# Patient Record
Sex: Male | Born: 1995 | Race: Black or African American | Hispanic: No | Marital: Single | Smoking: Current every day smoker
Health system: Southern US, Community
[De-identification: ages and names within clinical notes are randomized; demographics above are authoritative.]

## PROBLEM LIST (undated history)

## (undated) DIAGNOSIS — D75A Glucose-6-phosphate dehydrogenase (G6PD) deficiency without anemia: Secondary | ICD-10-CM

---

## 2017-12-31 ENCOUNTER — Other Ambulatory Visit: Payer: Self-pay

## 2017-12-31 ENCOUNTER — Emergency Department
Admission: EM | Admit: 2017-12-31 | Discharge: 2017-12-31 | Disposition: A | Discharge status: Home / Self Care | Payer: Self-pay | Attending: Emergency Medicine | Admitting: Emergency Medicine

## 2017-12-31 ENCOUNTER — Encounter: Payer: Self-pay | Admitting: Emergency Medicine

## 2017-12-31 DIAGNOSIS — K0889 Other specified disorders of teeth and supporting structures: Secondary | ICD-10-CM | POA: Insufficient documentation

## 2017-12-31 MED ORDER — TRAMADOL HCL 50 MG PO TABS: 50.0000 mg | ORAL_TABLET | Freq: Four times a day (QID) | ORAL | 0 refills | Status: AC | PRN

## 2017-12-31 MED ORDER — PENICILLIN V POTASSIUM 500 MG PO TABS: 500.0000 mg | ORAL_TABLET | Freq: Four times a day (QID) | ORAL | 0 refills | Status: DC

## 2017-12-31 NOTE — ED Provider Notes (Signed)
[facility] Emergency Department Provider Note  Time seen: 7:35 AM  I have reviewed the triage vital signs and the nursing notes.   HISTORY  Chief Complaint Dental Pain    HPI Roger Olson is a 22 y.o. male with no past medical history who presents to the emergency department for dental pain.  According to the patient he was eating chicken wings last night, states when he bit down he felt a crack and part of his tooth came out.  Patient has fractured his left upper molar.  States pain especially when breathing in air or attempting to drink anything.  Came to the emergency department for evaluation.  Denies any fever.  No vomiting.  No other medical complaints.   No past medical history on file.  There are no active problems to display for this patient.   History reviewed. No pertinent surgical history.  Prior to Admission medications   Medication Sig Start Date End Date Taking? Authorizing Provider  penicillin v potassium (VEETID) 500 MG tablet Take 1 tablet (500 mg total) by mouth 4 (four) times daily. 12/31/17   Minna AntisPaduchowski, Jc Veron, MD  traMADol (ULTRAM) 50 MG tablet Take 1 tablet (50 mg total) by mouth every 6 (six) hours as needed. 12/31/17   Minna AntisPaduchowski, Jawaan Adachi, MD    No Known Allergies  No family history on file.  Social History Social History   Tobacco Use  . Smoking status: Not on file  Substance Use Topics  . Alcohol use: Not on file  . Drug use: Not on file    Review of Systems Constitutional: Negative for fever ENT: Left upper dental pain Gastrointestinal: Negative for vomiting All other ROS negative  ____________________________________________   PHYSICAL EXAM:  VITAL SIGNS: ED Triage Vitals  Enc Vitals Group     BP 12/31/17 0504 (!) 141/90     Pulse Rate 12/31/17 0504 60     Resp 12/31/17 0504 20     Temp 12/31/17 0504 98.2 F (36.8 C)     Temp Source 12/31/17 0504 Oral     SpO2 12/31/17 0504 100 %     Weight  12/31/17 0456 154 lb (69.9 kg)     Height 12/31/17 0456 6' (1.829 m)     Head Circumference --      Peak Flow --      Pain Score 12/31/17 0457 10     Pain Loc --      Pain Edu? --      Excl. in GC? --     Constitutional: Alert and oriented. Well appearing and in no distress. Eyes: Normal exam ENT   Head: Normocephalic and atraumatic   Mouth/Throat: Mucous membranes are moist.  Patient does have a fractured left upper molar.  No signs of abscess. Cardiovascular: Normal rate, regular rhythm.  Respiratory: Normal respiratory effort without tachypnea nor retractions. Breath sounds are clear  Musculoskeletal: Nontender with normal range of motion in all extremities.  Neurologic:  Normal speech and language. No gross focal neurologic deficits  Skin:  Skin is warm, dry and intact.  Psychiatric: Mood and affect are normal.   ____________________________________________    INITIAL IMPRESSION / ASSESSMENT AND PLAN / ED COURSE  Pertinent labs & imaging results that were available during my care of the patient were reviewed by me and considered in my medical decision making (see chart for details).  Patient presents emergency department for left upper molar fracture while eating last night.  I discussed with the patient  the need to follow-up with a dentist as soon as possible as the tooth will either need to be extracted or crowned.  We will place the patient a short course of pain medication, cover with antibiotics.  Patient will begin calling dental clinics today.  ____________________________________________   FINAL CLINICAL IMPRESSION(S) / ED DIAGNOSES  Dental fracture Dental pain    Minna AntisPaduchowski, Miqueas Whilden, MD 12/31/17 [phone]

## 2017-12-31 NOTE — ED Notes (Signed)
This RN to bedside at this time to introduce self to patient. Pt sitting on side of bed in NAD, A&O x 4, skin warm, dry, and intact, respirations even and unlabored. Pt denies any needs at this time. Will continue to monitor for further patient needs.

## 2017-12-31 NOTE — Discharge Instructions (Addendum)
OPTIONS FOR DENTAL FOLLOW UP CARE ° °[place] Department of Health and Human Services - Local Safety Net Dental Clinics °http://www.ncdhhs.gov/dph/oralhealth/services/safetynetclinics.htm °  °[facility] (336-562-3123) ° °[facility] [place] (919-933-9087) ° °[facility] ([phone] ext [phone]) ° °[facility] ([phone]) ° °[facility] ([phone]) °This clinic caters to the indigent population and is on a lottery system. °Location: °[facility], [address], [place] °Clinic Hours: °Wednesdays from 6pm - 9pm, patients seen by a lottery system. °For dates, call or go to www.med.[facility].edu/[facility]/patients/Dental-[facility] °Services: °Cleanings, fillings and simple extractions. °Payment Options: °DENTAL WORK IS FREE OF CHARGE. Bring proof of income or support. °Best way to get seen: °Arrive at 5:15 pm - this is a lottery, NOT first come/first serve, so arriving earlier will not increase your chances of being seen. °  °  °[facility] °[phone] °Select option 1 for emergencies °  °Location: °[facility], [address], [place] °Clinic Hours: °No walk-ins accepted - call the day before to schedule an appointment. °Check in times are 9:30 am and 1:30 pm. °Services: °Simple extractions, temporary fillings, pulpectomy/pulp debridement, uncomplicated abscess drainage. °Payment Options: °PAYMENT IS DUE AT THE TIME OF SERVICE.  Fee is usually $100-200, additional surgical procedures (e.g. abscess drainage) may be extra. °Cash, checks, Visa/MasterCard accepted.  Can file Medicaid if patient is covered for dental - patient should call case worker to check. °No discount for [facility] patients. °Best way to get seen: °MUST call the day before and get onto the schedule. Can usually be seen the next 1-2 days. No walk-ins accepted. °  °  °[facility] °[phone] °   °Location: °[facility], [address], [place] °Clinic Hours: °M, W, Th, F 8am or 1:30pm, Tues 9a or 1:30 - first come/first served. °Services: °Simple extractions, temporary fillings, uncomplicated abscess drainage.  You do not need to be an [place] resident. °Payment Options: °PAYMENT IS DUE AT THE TIME OF SERVICE. °Dental insurance, otherwise sliding scale - bring proof of income or support. °Depending on income and treatment needed, cost is usually $50-200. °Best way to get seen: °Arrive early as it is first come/first served. °  °  °[facility] °[phone] °  °Location: °[address] °Clinic Hours: °Mon-Thu 8a-5p °Services: °Most basic dental services including extractions and fillings. °Payment Options: °PAYMENT IS DUE AT THE TIME OF SERVICE. °Sliding scale, up to 50% off - bring proof if income or support. °Medicaid with dental option accepted. °Best way to get seen: °Call to schedule an appointment, can usually be seen within 2 weeks OR they will try to see walk-ins - show up at 8a or 2p (you may have to wait). °  °  °[facility] °919-245-2435 °[place] RESIDENTS ONLY °  °Location: °[facility], [address], [place], [place] [address] °Clinic Hours: By appointment only. °Monday - Thursday 8am-5pm, Friday 8am-12pm °Services: Cleanings, fillings, extractions. °Payment Options: °PAYMENT IS DUE AT THE TIME OF SERVICE. °Cash, Visa or MasterCard. Sliding scale - $30 minimum per service. °Best way to get seen: °Come in to office, complete packet and make an appointment - need proof of income °or support monies for each household member and proof of [place] residence. °Usually takes about a month to get in. °  °  °[facility] °[phone] °  °Location: °[address]   [place] °Clinic Hours: Walk-in Urgent Care Dental Services are offered Monday-Friday  mornings only. °The numbers of emergencies accepted daily is limited to the number of °providers available. °Maximum 15 - Mondays, Wednesdays & Thursdays °Maximum 10 - Tuesdays & Fridays °Services: °You do not need to be a [place] resident to be seen for a dental emergency. °Emergencies are defined as pain, swelling, abnormal bleeding, or dental trauma. Walkins will receive x-rays if needed. °NOTE: Dental cleaning is not an emergency. °Payment Options: °PAYMENT IS DUE AT THE TIME OF SERVICE. °Minimum co-pay is $40.00 for uninsured patients. °Minimum co-pay is $3.00 for Medicaid with dental coverage. °Dental Insurance is accepted and must be presented at time of visit. °Medicare does not cover dental. °Forms of payment: Cash, credit card, checks. °Best way to get seen: °If not previously registered with the clinic, walk-in dental registration begins at 7:15 am and is on a first come/first serve basis. °If previously registered with the clinic, call to make an appointment. °  °  °[facility] °[phone] °[place] RESIDENTS ONLY °  °Location: °[address], [place], [place] °Clinic Hours: °Mon-Thu 10a-2p °Services: Extractions only! °Payment Options: °FREE (donations accepted) - bring proof of income or support °Best way to get seen: °Call and schedule an appointment OR come at 8am on the 1st Monday of every month (except for holidays) when it is first come/first served. °  °  °[facility] °[phone] °  °Location: °[address], [place] °Clinic Hours: °Friday mornings °Services, Payment Options, Best way to get seen: °Call for info °

## 2017-12-31 NOTE — ED Triage Notes (Addendum)
Patient ambulatory to triage with steady gait, without difficulty or distress noted; reports left upper toothache tonight; no meds taken PTA; pt is irritable, not wanting to answer questioning; explained to pt importance of obtaining medical hx and information in order to treat pt appropriately
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
## 2018-01-02 ENCOUNTER — Other Ambulatory Visit: Payer: Self-pay

## 2018-01-02 ENCOUNTER — Emergency Department
Admission: EM | Admit: 2018-01-02 | Discharge: 2018-01-02 | Discharge status: Against Medical Advice | Payer: Self-pay | Attending: Emergency Medicine | Admitting: Emergency Medicine

## 2018-01-02 DIAGNOSIS — Z5321 Procedure and treatment not carried out due to patient leaving prior to being seen by health care provider: Secondary | ICD-10-CM | POA: Insufficient documentation

## 2018-01-02 DIAGNOSIS — K0889 Other specified disorders of teeth and supporting structures: Secondary | ICD-10-CM | POA: Insufficient documentation

## 2018-01-02 NOTE — ED Notes (Signed)
No answer when called several times from lobby 

## 2018-01-02 NOTE — ED Triage Notes (Signed)
Pt arrives to ED via POV from home with c/o left upper-sided dental pain. Pt states he was seen here on Tuesday, r/x'd Tramadol and given Penicillin. Pt returns for uncontrolled pain relief and unalleviated s/x's.

## 2018-07-08 ENCOUNTER — Emergency Department
Admission: EM | Admit: 2018-07-08 | Discharge: 2018-07-08 | Disposition: A | Discharge status: Home / Self Care | Payer: BC Managed Care – PPO | Attending: Emergency Medicine | Admitting: Emergency Medicine

## 2018-07-08 ENCOUNTER — Emergency Department: Payer: BC Managed Care – PPO

## 2018-07-08 ENCOUNTER — Encounter: Payer: Self-pay | Admitting: Emergency Medicine

## 2018-07-08 ENCOUNTER — Other Ambulatory Visit: Payer: Self-pay

## 2018-07-08 DIAGNOSIS — Y9355 Activity, bike riding: Secondary | ICD-10-CM | POA: Insufficient documentation

## 2018-07-08 DIAGNOSIS — Y92410 Unspecified street and highway as the place of occurrence of the external cause: Secondary | ICD-10-CM | POA: Insufficient documentation

## 2018-07-08 DIAGNOSIS — F17200 Nicotine dependence, unspecified, uncomplicated: Secondary | ICD-10-CM | POA: Insufficient documentation

## 2018-07-08 DIAGNOSIS — G44319 Acute post-traumatic headache, not intractable: Secondary | ICD-10-CM

## 2018-07-08 DIAGNOSIS — Y999 Unspecified external cause status: Secondary | ICD-10-CM | POA: Insufficient documentation

## 2018-07-08 DIAGNOSIS — S0990XA Unspecified injury of head, initial encounter: Secondary | ICD-10-CM | POA: Diagnosis present

## 2018-07-08 DIAGNOSIS — T07XXXA Unspecified multiple injuries, initial encounter: Secondary | ICD-10-CM

## 2018-07-08 HISTORY — DX: Glucose-6-phosphate dehydrogenase (G6PD) deficiency without anemia: D75.A

## 2018-07-08 MED ORDER — OXYCODONE-ACETAMINOPHEN 5-325 MG PO TABS
1.0000 | ORAL_TABLET | Freq: Once | ORAL | Status: AC
  Administered 2018-07-08: 1 via ORAL
  Filled 2018-07-08: qty 1

## 2018-07-08 MED ORDER — HYDROCODONE-ACETAMINOPHEN 5-325 MG PO TABS: 1.0000 | ORAL_TABLET | ORAL | 0 refills | Status: DC | PRN

## 2018-07-08 MED ORDER — METHOCARBAMOL 500 MG PO TABS: 500.0000 mg | ORAL_TABLET | Freq: Four times a day (QID) | ORAL | 0 refills | Status: AC

## 2018-07-08 MED ORDER — PREDNISONE 50 MG PO TABS: 50.0000 mg | ORAL_TABLET | Freq: Every day | ORAL | 0 refills | Status: DC

## 2018-07-08 MED ORDER — PREDNISONE 20 MG PO TABS
60.0000 mg | ORAL_TABLET | Freq: Once | ORAL | Status: AC
  Administered 2018-07-08: 60 mg via ORAL
  Filled 2018-07-08: qty 3
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
## 2018-07-08 NOTE — ED Notes (Addendum)
E sign not working. PT verbalized d/c instructions

## 2018-07-11 ENCOUNTER — Emergency Department: Payer: BC Managed Care – PPO

## 2018-07-11 ENCOUNTER — Emergency Department
Admission: EM | Admit: 2018-07-11 | Discharge: 2018-07-11 | Disposition: A | Discharge status: Home / Self Care | Payer: BC Managed Care – PPO | Attending: Emergency Medicine | Admitting: Emergency Medicine

## 2018-07-11 ENCOUNTER — Encounter: Payer: Self-pay | Admitting: Emergency Medicine

## 2018-07-11 DIAGNOSIS — R1030 Lower abdominal pain, unspecified: Secondary | ICD-10-CM

## 2018-07-11 DIAGNOSIS — F17210 Nicotine dependence, cigarettes, uncomplicated: Secondary | ICD-10-CM | POA: Diagnosis not present

## 2018-07-11 LAB — URINALYSIS, COMPLETE (UACMP) WITH MICROSCOPIC
Bacteria, UA: NONE SEEN
Bilirubin Urine: NEGATIVE
Glucose, UA: NEGATIVE mg/dL
Hgb urine dipstick: NEGATIVE
Ketones, ur: NEGATIVE mg/dL
Nitrite: NEGATIVE
Protein, ur: NEGATIVE mg/dL
Specific Gravity, Urine: 1.014 (ref 1.005–1.030)
pH: 6 (ref 5.0–8.0)

## 2018-07-11 LAB — COMPREHENSIVE METABOLIC PANEL
ALT: 10 U/L (ref 0–44)
AST: 12 U/L — ABNORMAL LOW (ref 15–41)
Albumin: 4 g/dL (ref 3.5–5.0)
Alkaline Phosphatase: 37 U/L — ABNORMAL LOW (ref 38–126)
Anion gap: 8 (ref 5–15)
BUN: 6 mg/dL (ref 6–20)
CO2: 29 mmol/L (ref 22–32)
Calcium: 9 mg/dL (ref 8.9–10.3)
Chloride: 102 mmol/L (ref 98–111)
Creatinine, Ser: 0.99 mg/dL (ref 0.61–1.24)
GFR calc Af Amer: >60 mL/min (ref >60)
GFR calc non Af Amer: >60 mL/min (ref >60)
Glucose, Bld: 94 mg/dL (ref 70–99)
Potassium: 3.9 mmol/L (ref 3.5–5.1)
Sodium: 139 mmol/L (ref 135–145)
Total Bilirubin: 1 mg/dL (ref 0.3–1.2)
Total Protein: 6.7 g/dL (ref 6.5–8.1)

## 2018-07-11 LAB — CBC
HCT: 42.1 % (ref 39.0–52.0)
Hemoglobin: 13.9 g/dL (ref 13.0–17.0)
MCH: 29.6 pg (ref 26.0–34.0)
MCHC: 33 g/dL (ref 30.0–36.0)
MCV: 89.8 fL (ref 80.0–100.0)
Platelets: 244 10*3/uL (ref 150–400)
RBC: 4.69 MIL/uL (ref 4.22–5.81)
RDW: 12.1 % (ref 11.5–15.5)
WBC: 9.9 10*3/uL (ref 4.0–10.5)
nRBC: 0 % (ref 0.0–0.2)

## 2018-07-11 LAB — LIPASE, BLOOD: Lipase: 29 U/L (ref 11–51)

## 2018-07-11 MED ORDER — MORPHINE SULFATE (PF) 4 MG/ML IV SOLN
4.0000 mg | Freq: Once | INTRAVENOUS | Status: AC
  Administered 2018-07-11: 4 mg via INTRAVENOUS
  Filled 2018-07-11: qty 1

## 2018-07-11 MED ORDER — OXYCODONE-ACETAMINOPHEN 5-325 MG PO TABS: 1.0000 | ORAL_TABLET | ORAL | 0 refills | Status: AC | PRN

## 2018-07-11 MED ORDER — IOHEXOL 240 MG/ML SOLN
50.0000 mL | Freq: Once | INTRAMUSCULAR | Status: AC | PRN
  Administered 2018-07-11: 50 mL via ORAL

## 2018-07-11 MED ORDER — CEPHALEXIN 500 MG PO CAPS: 500.0000 mg | ORAL_CAPSULE | Freq: Two times a day (BID) | ORAL | 0 refills | Status: AC

## 2018-07-11 MED ORDER — IOHEXOL 300 MG/ML  SOLN
100.0000 mL | Freq: Once | INTRAMUSCULAR | Status: AC | PRN
  Administered 2018-07-11: 100 mL via INTRAVENOUS

## 2018-07-11 MED ORDER — SODIUM CHLORIDE 0.9% FLUSH
3.0000 mL | Freq: Once | INTRAVENOUS | Status: AC
  Administered 2018-07-11: 3 mL via INTRAVENOUS

## 2018-07-11 MED ORDER — OXYCODONE-ACETAMINOPHEN 5-325 MG PO TABS
1.0000 | ORAL_TABLET | Freq: Once | ORAL | Status: AC
  Administered 2018-07-11: 1 via ORAL
  Filled 2018-07-11: qty 1

## 2018-07-11 MED ORDER — ONDANSETRON HCL 4 MG/2ML IJ SOLN
4.0000 mg | Freq: Once | INTRAMUSCULAR | Status: AC
  Administered 2018-07-11: 4 mg via INTRAVENOUS
  Filled 2018-07-11: qty 2

## 2018-07-11 NOTE — ED Notes (Signed)
Patient transported to CT 
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
## 2018-07-11 NOTE — Discharge Instructions (Addendum)
If you continue to have pain in the left hip, STOP taking the hydrocodone and you can start taking the oxycodone which is slightly stronger.  DO NOT take both of these medications at the same time.  You can continue the steroid and muscle relaxant.  We have also prescribed and take this as prescribed and finish the full 7-day course.  Antibiotic as you may have a mild bladder infection.  Your lab work and CT scan today did not show any concerning findings in your abdomen.  Return to the ER for new, worsening, or persistent severe pain, fever, vomiting, or any other new or worsening symptoms that concern you.

## 2018-07-11 NOTE — ED Notes (Signed)
Patient resting on stretcher. Posture relaxed.  Skin warm and dry.  MAE equally and strong.  texting on phone.  States abdominal pain has improved, but continues to c/o left hip pain.  Patient is resting and in NAD

## 2018-07-11 NOTE — ED Notes (Signed)
Pt observed sitting w/c using his phone, no distress noted, cont to monitor

## 2018-07-11 NOTE — ED Notes (Signed)
Pt observed rolling himself outside in w/c

## 2018-09-22 ENCOUNTER — Emergency Department
Admission: EM | Admit: 2018-09-22 | Discharge: 2018-09-22 | Disposition: A | Discharge status: Home / Self Care | Payer: BC Managed Care – PPO | Attending: Student in an Organized Health Care Education/Training Program | Admitting: Student in an Organized Health Care Education/Training Program

## 2018-09-22 ENCOUNTER — Other Ambulatory Visit: Payer: Self-pay

## 2018-09-22 DIAGNOSIS — F17200 Nicotine dependence, unspecified, uncomplicated: Secondary | ICD-10-CM | POA: Insufficient documentation

## 2018-09-22 DIAGNOSIS — Z79899 Other long term (current) drug therapy: Secondary | ICD-10-CM | POA: Diagnosis not present

## 2018-09-22 DIAGNOSIS — Z20828 Contact with and (suspected) exposure to other viral communicable diseases: Secondary | ICD-10-CM | POA: Insufficient documentation

## 2018-09-22 DIAGNOSIS — J029 Acute pharyngitis, unspecified: Secondary | ICD-10-CM | POA: Diagnosis present

## 2018-09-22 LAB — GROUP A STREP BY PCR: Group A Strep by PCR: NOT DETECTED

## 2018-09-22 MED ORDER — ACETAMINOPHEN 160 MG/5ML PO SOLN
650.0000 mg | Freq: Once | ORAL | Status: AC
  Administered 2018-09-22: 21:00:00 650 mg via ORAL
  Filled 2018-09-22: qty 20.3

## 2018-09-22 MED ORDER — DEXAMETHASONE 10 MG/ML FOR PEDIATRIC ORAL USE
10.0000 mg | Freq: Once | INTRAMUSCULAR | Status: AC
  Administered 2018-09-22: 10 mg via ORAL
  Filled 2018-09-22: qty 1

## 2018-09-22 NOTE — ED Provider Notes (Signed)
[facility] Emergency Department Provider Note  ____________________________________________  Time seen: Approximately 9:10 PM  I have reviewed the triage vital signs and the nursing notes.   HISTORY  Chief Complaint Sore Throat    HPI Roger Olson is a 23 y.o. male presents to the emergency department with pharyngitis and headache for the past 4 days.  Patient reports that he feels discomfort at midline throat.  Patient states that he feels discomfort when he swallows any takes a deep breath.  He denies associated rhinorrhea, nasal congestion or nonproductive cough.  No abdominal discomfort or nausea.  Patient denies chest pain, chest tightness or abdominal pain.  No other alleviating measures have been attempted.        Past Medical History:  Diagnosis Date  . G6PD deficiency     There are no active problems to display for this patient.   No past surgical history on file.  Prior to Admission medications   Medication Sig Start Date End Date Taking? Authorizing Provider  methocarbamol (ROBAXIN) 500 MG tablet Take 1 tablet (500 mg total) by mouth 4 (four) times daily. 07/08/18   Cuthriell, Delorise RoyalsJonathan D, PA-C  penicillin v potassium (VEETID) 500 MG tablet Take 1 tablet (500 mg total) by mouth 4 (four) times daily. 12/31/17   Minna AntisPaduchowski, Kevin, MD  predniSONE (DELTASONE) 50 MG tablet Take 1 tablet (50 mg total) by mouth daily with breakfast. 07/08/18   Cuthriell, Delorise RoyalsJonathan D, PA-C  traMADol (ULTRAM) 50 MG tablet Take 1 tablet (50 mg total) by mouth every 6 (six) hours as needed. 12/31/17   Minna AntisPaduchowski, Kevin, MD    Allergies Ibuprofen and Shellfish allergy  No family history on file.  Social History Social History   Tobacco Use  . Smoking status: Current Every Day Smoker  . Smokeless tobacco: Never Used  Substance Use Topics  . Alcohol use: Not Currently  . Drug use: Not on file     Review of Systems  Constitutional: No  fever/chills Eyes: No visual changes. No discharge ENT: Patient has pharyngitis. Cardiovascular: no chest pain. Respiratory: no cough. No SOB. Gastrointestinal: No abdominal pain.  No nausea, no vomiting.  No diarrhea.  No constipation.  Genitourinary: Negative for dysuria. No hematuria Musculoskeletal: Negative for musculoskeletal pain. Skin: Negative for rash, abrasions, lacerations, ecchymosis. Neurological: Patient has headache.   ____________________________________________   PHYSICAL EXAM:  VITAL SIGNS: ED Triage Vitals [09/22/18 2032]  Enc Vitals Group     BP 132/85     Pulse Rate 99     Resp 20     Temp 99.8 F (37.7 C)     Temp Source Oral     SpO2 97 %     Weight 154 lb (69.9 kg)     Height 5\' 11"  (1.803 m)     Head Circumference      Peak Flow      Pain Score 10     Pain Loc      Pain Edu?      Excl. in GC?      Constitutional: Alert and oriented. Well appearing and in no acute distress. Eyes: Conjunctivae are normal. PERRL. EOMI. Head: Atraumatic. ENT:      Ears: TMs are pearly.      Nose: No congestion/rhinnorhea.      Mouth/Throat: Mucous membranes are moist.  Posterior pharynx is erythematous with petechiae visualized.  Uvula is midline.  Tonsillar hypertrophy is symmetric. Neck: No stridor.  No cervical spine tenderness to  palpation. Hematological/Lymphatic/Immunilogical: Palpable cervical lymphadenopathy.  Cardiovascular: Normal rate, regular rhythm. Normal S1 and S2.  Good peripheral circulation. Respiratory: Normal respiratory effort without tachypnea or retractions. Lungs CTAB. Good air entry to the bases with no decreased or absent breath sounds. Gastrointestinal: Bowel sounds 4 quadrants. Soft and nontender to palpation. No guarding or rigidity. No palpable masses. No distention. No CVA tenderness. Musculoskeletal: Full range of motion to all extremities. No gross deformities appreciated. Neurologic:  Normal speech and language. No gross focal  neurologic deficits are appreciated.  Skin:  Skin is warm, dry and intact. No rash noted. Psychiatric: Mood and affect are normal. Speech and behavior are normal. Patient exhibits appropriate insight and judgement.   ____________________________________________   LABS (all labs ordered are listed, but only abnormal results are displayed)  Labs Reviewed  GROUP A STREP BY PCR  SARS CORONAVIRUS 2 (TAT 6-24 HRS)   ____________________________________________  EKG   ____________________________________________  RADIOLOGY   No results found.  ____________________________________________    PROCEDURES  Procedure(s) performed:    Procedures    Medications  acetaminophen (TYLENOL) solution 650 mg (650 mg Oral Given 09/22/18 2124)  dexamethasone (DECADRON) 10 MG/ML injection for Pediatric ORAL use 10 mg (10 mg Oral Given 09/22/18 2126)     ____________________________________________   INITIAL IMPRESSION / ASSESSMENT AND PLAN / ED COURSE  Pertinent labs & imaging results that were available during my care of the patient were reviewed by me and considered in my medical decision making (see chart for details).  Review of the [place] CSRS was performed in accordance of the [place] prior to dispensing any controlled drugs.           Assessment and plan Pharyngitis 23 y/o male presents to the ED with pharyngitis and headache for the past four days.   Vital signs were reassuring at triage. Posterior pharynx is erythematous with anterior cervical lymphadenopathy.  Tonsillar hypertrophy is symmetric without uvular deviation.  Differential diagnosis includes group A strep pharyngitis, unspecified viral pharyngitis and COVID-19.  Group A strep testing was negative in the emergency department.  COVID-19 testing is pending at this time.  Patient was given oral Decadron in the emergency department as well as Tylenol and he reports that his pharyngitis improved.  Return precautions  were given.  All patient questions were answered.  Roger Olson was evaluated in Emergency Department on 09/22/2018 for the symptoms described in the history of present illness. He was evaluated in the context of the global COVID-19 pandemic, which necessitated consideration that the patient might be at risk for infection with the SARS-CoV-2 virus that causes COVID-19. Institutional protocols and algorithms that pertain to the evaluation of patients at risk for COVID-19 are in a state of rapid change based on information released by regulatory bodies including the CDC and federal and state organizations. These policies and algorithms were followed during the patient's care in the ED.     ____________________________________________  FINAL CLINICAL IMPRESSION(S) / ED DIAGNOSES  Final diagnoses:  Viral pharyngitis      NEW MEDICATIONS STARTED DURING THIS VISIT:  ED Discharge Orders    None          This chart was dictated using voice recognition software/Dragon. Despite best efforts to proofread, errors can occur which can change the meaning. Any change was purely unintentional.    Lannie Fields, PA-C 09/22/18 2306    Merlyn Lot, MD 09/22/18 2340

## 2018-09-22 NOTE — Discharge Instructions (Signed)
Take Tylenol and ibuprofen alternating every 4 hours for pharyngitis. Stay quarantine at home until COVID-19 results return.

## 2018-09-22 NOTE — ED Triage Notes (Signed)
Pt in with co sore throat that is worse when he swallows and takes a deep breath. STates symptoms started 4 days ago. Pt states if he doesn't swallow or take a deep breath he does not the feel pain.

## 2018-09-23 LAB — SARS CORONAVIRUS 2 (TAT 6-24 HRS): SARS Coronavirus 2: NEGATIVE

## 2018-11-14 ENCOUNTER — Encounter: Payer: Self-pay | Admitting: Physician Assistant

## 2018-11-14 ENCOUNTER — Other Ambulatory Visit: Payer: Self-pay

## 2018-11-14 ENCOUNTER — Ambulatory Visit: Payer: Self-pay | Admitting: Physician Assistant

## 2018-11-14 ENCOUNTER — Other Ambulatory Visit: Payer: Self-pay | Admitting: Family Medicine

## 2018-11-14 DIAGNOSIS — N341 Nonspecific urethritis: Secondary | ICD-10-CM

## 2018-11-14 DIAGNOSIS — Z113 Encounter for screening for infections with a predominantly sexual mode of transmission: Secondary | ICD-10-CM

## 2018-11-14 MED ORDER — AZITHROMYCIN 500 MG PO TABS
1000.0000 mg | ORAL_TABLET | Freq: Once | ORAL | Status: AC
  Administered 2018-11-14: 19:00:00 1000 mg via ORAL

## 2018-11-14 NOTE — Progress Notes (Signed)
    STI clinic/screening visit  Subjective:  Roger Olson is a 23 y.o. male being seen today for an STI screening visit. The patient reports they do have symptoms.  Patient has the following medical conditions:  There are no active problems to display for this patient.    Chief Complaint  Patient presents with  . SEXUALLY TRANSMITTED DISEASE    HPI  Patient reports that he has been having dysuria and yellow discharge for 2 weeks.  Denies other symptoms.  See flowsheet for further details and programmatic requirements.    The following portions of the patient's history were reviewed and updated as appropriate: allergies, current medications, past medical history, past social history, past surgical history and problem list.  Objective:  There were no vitals filed for this visit.  Physical Exam Constitutional:      General: He is not in acute distress.    Appearance: Normal appearance.  HENT:     Head: Normocephalic and atraumatic.     Mouth/Throat:     Mouth: Mucous membranes are moist.     Pharynx: Oropharynx is clear. No oropharyngeal exudate or posterior oropharyngeal erythema.  Eyes:     Conjunctiva/sclera: Conjunctivae normal.  Neck:     Musculoskeletal: Neck supple.  Pulmonary:     Effort: Pulmonary effort is normal.  Abdominal:     Palpations: Abdomen is soft. There is no mass.     Tenderness: There is no abdominal tenderness. There is no guarding or rebound.  Genitourinary:    Penis: Normal.      Scrotum/Testes: Normal.     Comments: Pubic area without nits, lice, edema,erythema, lesions and inguinal adenopathy. Penis circumcised and without discharge at meatus. Lymphadenopathy:     Cervical: No cervical adenopathy.  Skin:    General: Skin is warm and dry.     Findings: No bruising, erythema, lesion or rash.  Neurological:     Mental Status: He is alert and oriented to person, place, and time.  Psychiatric:        Mood and Affect: Mood  normal.        Behavior: Behavior normal.        Thought Content: Thought content normal.        Judgment: Judgment normal.       Assessment and Plan:  Roger Olson is a 23 y.o. male presenting to the [facility] for STI screening  1. Screening for STD (sexually transmitted disease) Patient with symptoms today.  Rec condoms with all sex. Await test results.  Counseled that RN will call if needs to RTC for further treatment once results are back.   2. NGU (nongonococcal urethritis) Will treat for NGU with Azithromycin 1g po DOT today No sex for 7 days and until after partner completes treatment. RTC if vomits < 2 hr after taking medicine for re-treatment. - azithromycin (ZITHROMAX) tablet 1,000 mg     No follow-ups on file.  No future appointments.  Jerene Dilling, PA

## 2018-11-17 LAB — GRAM STAIN

## 2018-11-19 LAB — GONOCOCCUS CULTURE

## 2018-12-03 NOTE — Addendum Note (Signed)
Addended by: Cletis Media on: 12/03/2018 02:20 PM   Modules accepted: Orders

## 2019-05-31 ENCOUNTER — Other Ambulatory Visit: Payer: Self-pay

## 2019-05-31 ENCOUNTER — Emergency Department
Admission: EM | Admit: 2019-05-31 | Discharge: 2019-05-31 | Disposition: A | Discharge status: Home / Self Care | Payer: BC Managed Care – PPO | Attending: Emergency Medicine | Admitting: Emergency Medicine

## 2019-05-31 ENCOUNTER — Encounter: Payer: Self-pay | Admitting: Emergency Medicine

## 2019-05-31 ENCOUNTER — Emergency Department: Payer: BC Managed Care – PPO

## 2019-05-31 DIAGNOSIS — S8391XA Sprain of unspecified site of right knee, initial encounter: Secondary | ICD-10-CM | POA: Insufficient documentation

## 2019-05-31 DIAGNOSIS — Y9302 Activity, running: Secondary | ICD-10-CM | POA: Insufficient documentation

## 2019-05-31 DIAGNOSIS — X509XXA Other and unspecified overexertion or strenuous movements or postures, initial encounter: Secondary | ICD-10-CM | POA: Insufficient documentation

## 2019-05-31 DIAGNOSIS — Y929 Unspecified place or not applicable: Secondary | ICD-10-CM | POA: Diagnosis not present

## 2019-05-31 DIAGNOSIS — Y998 Other external cause status: Secondary | ICD-10-CM | POA: Diagnosis not present

## 2019-05-31 DIAGNOSIS — S8991XA Unspecified injury of right lower leg, initial encounter: Secondary | ICD-10-CM | POA: Diagnosis present

## 2019-05-31 DIAGNOSIS — F17200 Nicotine dependence, unspecified, uncomplicated: Secondary | ICD-10-CM | POA: Insufficient documentation

## 2019-05-31 DIAGNOSIS — Z79899 Other long term (current) drug therapy: Secondary | ICD-10-CM | POA: Insufficient documentation

## 2019-05-31 MED ORDER — MELOXICAM 15 MG PO TABS
15.0000 mg | ORAL_TABLET | Freq: Every day | ORAL | 0 refills | Status: AC
Start: 2019-05-31 — End: ?

## 2019-05-31 MED ORDER — MELOXICAM 7.5 MG PO TABS
15.0000 mg | ORAL_TABLET | Freq: Once | ORAL | Status: AC
  Administered 2019-05-31: 15 mg via ORAL
  Filled 2019-05-31: qty 2
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
## 2019-05-31 NOTE — ED Notes (Signed)
See triage note, reports 10/10 right leg pain, pt in NAD at this time
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
## 2023-02-26 ENCOUNTER — Emergency Department
Admission: EM | Admit: 2023-02-26 | Discharge: 2023-02-26 | Disposition: A | Discharge status: Home / Self Care | Payer: 59 | Attending: Emergency Medicine | Admitting: Emergency Medicine

## 2023-02-26 ENCOUNTER — Emergency Department: Payer: 59

## 2023-02-26 ENCOUNTER — Other Ambulatory Visit: Payer: Self-pay

## 2023-02-26 DIAGNOSIS — R1031 Right lower quadrant pain: Secondary | ICD-10-CM | POA: Insufficient documentation

## 2023-02-26 DIAGNOSIS — K047 Periapical abscess without sinus: Secondary | ICD-10-CM | POA: Diagnosis not present

## 2023-02-26 DIAGNOSIS — R112 Nausea with vomiting, unspecified: Secondary | ICD-10-CM | POA: Insufficient documentation

## 2023-02-26 DIAGNOSIS — K0889 Other specified disorders of teeth and supporting structures: Secondary | ICD-10-CM | POA: Diagnosis present

## 2023-02-26 DIAGNOSIS — R1032 Left lower quadrant pain: Secondary | ICD-10-CM | POA: Diagnosis not present

## 2023-02-26 LAB — URINALYSIS, ROUTINE W REFLEX MICROSCOPIC
Bacteria, UA: NONE SEEN
Bilirubin Urine: NEGATIVE
Glucose, UA: NEGATIVE mg/dL
Hgb urine dipstick: NEGATIVE
Ketones, ur: NEGATIVE mg/dL
Leukocytes,Ua: NEGATIVE
Nitrite: NEGATIVE
Protein, ur: NEGATIVE mg/dL
Specific Gravity, Urine: 1.035 — ABNORMAL HIGH (ref 1.005–1.030)
pH: 5 (ref 5.0–8.0)

## 2023-02-26 LAB — COMPREHENSIVE METABOLIC PANEL
ALT: 24 U/L (ref 0–44)
AST: 23 U/L (ref 15–41)
Albumin: 4.1 g/dL (ref 3.5–5.0)
Alkaline Phosphatase: 42 U/L (ref 38–126)
Anion gap: 10 (ref 5–15)
BUN: 12 mg/dL (ref 6–20)
CO2: 26 mmol/L (ref 22–32)
Calcium: 9 mg/dL (ref 8.9–10.3)
Chloride: 104 mmol/L (ref 98–111)
Creatinine, Ser: 1.12 mg/dL (ref 0.61–1.24)
GFR, Estimated: >60 mL/min (ref >60)
Glucose, Bld: 102 mg/dL — ABNORMAL HIGH (ref 70–99)
Potassium: 3.6 mmol/L (ref 3.5–5.1)
Sodium: 140 mmol/L (ref 135–145)
Total Bilirubin: 1.1 mg/dL (ref 0.0–1.2)
Total Protein: 6.7 g/dL (ref 6.5–8.1)

## 2023-02-26 LAB — LIPASE, BLOOD: Lipase: 51 U/L (ref 11–51)

## 2023-02-26 LAB — CBC
HCT: 41.8 % (ref 39.0–52.0)
Hemoglobin: 13.9 g/dL (ref 13.0–17.0)
MCH: 29.8 pg (ref 26.0–34.0)
MCHC: 33.3 g/dL (ref 30.0–36.0)
MCV: 89.7 fL (ref 80.0–100.0)
Platelets: 245 10*3/uL (ref 150–400)
RBC: 4.66 MIL/uL (ref 4.22–5.81)
RDW: 12.1 % (ref 11.5–15.5)
WBC: 5.7 10*3/uL (ref 4.0–10.5)
nRBC: 0 % (ref 0.0–0.2)

## 2023-02-26 MED ORDER — MORPHINE SULFATE (PF) 4 MG/ML IV SOLN
4.0000 mg | Freq: Once | INTRAVENOUS | Status: AC
  Administered 2023-02-26: 4 mg via INTRAVENOUS
  Filled 2023-02-26: qty 1

## 2023-02-26 MED ORDER — IOHEXOL 300 MG/ML  SOLN
100.0000 mL | Freq: Once | INTRAMUSCULAR | Status: AC | PRN
  Administered 2023-02-26: 100 mL via INTRAVENOUS

## 2023-02-26 MED ORDER — ONDANSETRON HCL 4 MG/2ML IJ SOLN
4.0000 mg | Freq: Once | INTRAMUSCULAR | Status: AC
  Administered 2023-02-26: 4 mg via INTRAVENOUS
  Filled 2023-02-26: qty 2

## 2023-02-26 MED ORDER — OXYCODONE-ACETAMINOPHEN 5-325 MG PO TABS
1.0000 | ORAL_TABLET | Freq: Once | ORAL | Status: AC
  Administered 2023-02-26: 1 via ORAL
  Filled 2023-02-26: qty 1

## 2023-02-26 MED ORDER — PENICILLIN V POTASSIUM 500 MG PO TABS: 500.0000 mg | ORAL_TABLET | Freq: Four times a day (QID) | ORAL | 0 refills | Status: AC

## 2023-02-26 NOTE — ED Provider Notes (Signed)
-----------------------------------------   7:40 AM on 02/26/2023 -----------------------------------------  CT scan has resulted showing no acute finding.  We will discharge with outpatient follow-up.   Minna Antis, MD 02/26/23 [phone]

## 2023-02-26 NOTE — ED Provider Notes (Signed)
 [facility] Provider Note    Event Date/Time   First MD Initiated Contact with Patient 02/26/23 [phone]     (approximate)   History   Chief Complaint Abdominal Pain   HPI  Roger Olson is a 28 y.o. male with past medical history of G6PD deficiency who presents to the ED complaining of abdominal pain.  Patient reports that he had acute onset of pain to his lower abdomen a couple of hours prior to arrival.  He states it felt like his entire lower abdomen twisted up into a knot and that he needed to have a bowel movement, however he was unable to pass stool.  He has continued to have significant pain since then, reports of associated nausea and vomiting.  He denies any fevers, dysuria, or flank pain.  He denies any history of similar symptoms, has never had surgery on his abdomen.  He does state he has also been dealing with about a week of left upper dental pain, denies any associated swelling.  He has been taking Tylenol for this with partial relief.     Physical Exam   Triage Vital Signs: ED Triage Vitals  Encounter Vitals Group     BP 02/26/23 0503 (!) 150/106     Systolic BP Percentile --      Diastolic BP Percentile --      Pulse Rate 02/26/23 0503 60     Resp 02/26/23 0503 18     Temp 02/26/23 0503 98.2 F (36.8 C)     Temp Source 02/26/23 0503 Oral     SpO2 02/26/23 0503 100 %     Weight 02/26/23 0502 170 lb (77.1 kg)     Height 02/26/23 0502 6' (1.829 m)     Head Circumference --      Peak Flow --      Pain Score 02/26/23 0502 10     Pain Loc --      Pain Education --      Exclude from Growth Chart --     Most recent vital signs: Vitals:   02/26/23 0503  BP: (!) 150/106  Pulse: 60  Resp: 18  Temp: 98.2 F (36.8 C)  SpO2: 100%    Constitutional: Alert and oriented. Eyes: Conjunctivae are normal. Head: Atraumatic. Nose: No congestion/rhinnorhea. Mouth/Throat: Mucous membranes are moist.  Broken left upper molar with  associated tenderness and mild erythema, no significant edema or fluctuance noted. Cardiovascular: Normal rate, regular rhythm. Grossly normal heart sounds.  2+ radial pulses bilaterally. Respiratory: Normal respiratory effort.  No retractions. Lungs CTAB. Gastrointestinal: Soft and diffusely tender to palpation, greatest in the right lower quadrant. No distention. Musculoskeletal: No lower extremity tenderness nor edema.  Neurologic:  Normal speech and language. No gross focal neurologic deficits are appreciated.    ED Results / Procedures / Treatments   Labs (all labs ordered are listed, but only abnormal results are displayed) Labs Reviewed  COMPREHENSIVE METABOLIC PANEL - Abnormal; Notable for the following components:      Result Value   Glucose, Bld 102 (*)    All other components within normal limits  LIPASE, BLOOD  CBC  URINALYSIS, ROUTINE W REFLEX MICROSCOPIC    PROCEDURES:  Critical Care performed: No  Procedures   MEDICATIONS ORDERED IN ED: Medications  morphine (PF) 4 MG/ML injection 4 mg (has no administration in time range)  ondansetron (ZOFRAN) injection 4 mg (has no administration in time range)  oxyCODONE-acetaminophen (PERCOCET/ROXICET) 5-325 MG  per tablet 1 tablet (1 tablet Oral Given 02/26/23 0508)     IMPRESSION / MDM / ASSESSMENT AND PLAN / ED COURSE  I reviewed the triage vital signs and the nursing notes.                              28 y.o. male with past medical history of G6PD deficiency who presents to the ED complaining of a few days of left upper dental pain, now with pain in both sides of his lower abdomen starting earlier tonight.  Patient's presentation is most consistent with acute presentation with potential threat to life or bodily function.  Differential diagnosis includes, but is not limited to, appendicitis, colitis, diverticulitis, kidney stone, bowel obstruction, dental infection, dental abscess.  Patient nontoxic-appearing and  in no acute distress, vital signs are unremarkable.  His abdomen is soft with diffuse tenderness to palpation, greatest in the right lower quadrant.  Labs without significant anemia, leukocytosis, electrolyte abnormality, or AKI.  LFTs and lipase are unremarkable, will further assess with CT imaging to rule out appendicitis or other surgical pathology.  Examination of his oropharynx shows developing dental infection but no evidence of abscess.  We will treat with IV morphine and Zofran.  Patient turned over to oncoming provider pending CT results and reassessment.      FINAL CLINICAL IMPRESSION(S) / ED DIAGNOSES   Final diagnoses:  Right lower quadrant abdominal pain  Dental infection     Rx / DC Orders   ED Discharge Orders          Ordered    penicillin v potassium (VEETID) 500 MG tablet  4 times daily        02/26/23 0620             Note:  This document was prepared using Dragon voice recognition software and may include unintentional dictation errors.   Chesley Noon, MD 02/26/23 [phone]

## 2023-02-26 NOTE — ED Triage Notes (Signed)
 Pt reports dental pain and lower abd pain, pt had 1 episode of emesis PTA. Pt reports that he fells like he needs to have a BM but has been unable to only this morning. Pt denies urinary symptoms.

## 2023-06-30 ENCOUNTER — Encounter: Payer: Self-pay | Admitting: Emergency Medicine

## 2023-06-30 ENCOUNTER — Emergency Department
Admission: EM | Admit: 2023-06-30 | Discharge: 2023-06-30 | Disposition: A | Discharge status: Home / Self Care | Payer: Self-pay | Attending: Emergency Medicine | Admitting: Emergency Medicine

## 2023-06-30 ENCOUNTER — Emergency Department: Payer: Self-pay

## 2023-06-30 ENCOUNTER — Other Ambulatory Visit: Payer: Self-pay

## 2023-06-30 DIAGNOSIS — M79672 Pain in left foot: Secondary | ICD-10-CM | POA: Insufficient documentation

## 2023-06-30 DIAGNOSIS — M4186 Other forms of scoliosis, lumbar region: Secondary | ICD-10-CM | POA: Insufficient documentation

## 2023-06-30 LAB — URIC ACID: Uric Acid, Serum: 5 mg/dL (ref 3.7–8.6)

## 2023-06-30 MED ORDER — PREDNISONE 10 MG (21) PO TBPK: ORAL_TABLET | ORAL | 0 refills | Status: AC

## 2023-06-30 NOTE — ED Provider Notes (Signed)
 [facility] Provider Note    Event Date/Time   First MD Initiated Contact with Patient 06/30/23 1531     (approximate)   History   Foot Pain   HPI Roger Olson is a 28 y.o. male presenting to the emergency department with left foot pain x 1 day. States it feels tingly. Patient states he first noticed it yesterday after he left work.  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  Patient denies any fall or injury, back pain, saddle anesthesia, bowel or bladder incontinence, urinary retention, fever.  He has not taken any pain medication or tried any at home remedies for this pain.  He does have a contraindication to NSAIDs due to his G6PD deficiency.  States he drinks on occasion.  He does not have a history of gout, but he does have a family history of gout.      Physical Exam   Triage Vital Signs: ED Triage Vitals  Encounter Vitals Group     BP 06/30/23 1525 (!) 112/56     Girls Systolic BP Percentile --      Girls Diastolic BP Percentile --      Boys Systolic BP Percentile --      Boys Diastolic BP Percentile --      Pulse Rate 06/30/23 1525 64     Resp 06/30/23 1525 16     Temp 06/30/23 1525 98.5 F (36.9 C)     Temp Source 06/30/23 1525 Oral     SpO2 06/30/23 1525 98 %     Weight 06/30/23 1525 160 lb (72.6 kg)     Height 06/30/23 1525 5' 11 (1.803 m)     Head Circumference --      Peak Flow --      Pain Score 06/30/23 1527 10     Pain Loc --      Pain Education --      Exclude from Growth Chart --     Most recent vital signs: Vitals:   06/30/23 1525  BP: (!) 112/56  Pulse: 64  Resp: 16  Temp: 98.5 F (36.9 C)  SpO2: 98%    General: Well-appearing, in no acute distress. Appears stated age. CV: Regular rate, 64 bpm. Dorsalis pedis pulses 2+ bilaterally. <2 second capillary refill. Respiratory: No respiratory distress. Normal respiratory effort. Skin:Warm, dry, intact. No rashes, lesions, or ecchymosis. No  cyanosis or pallor. Neurological: A&Ox4 to person, place, time, and situation. Decreased sensation to left lateral and medial foot compared to right. GI: Soft, non-distended. MSK: No spinal tenderness or paraspinal tenderness. Normal ROM with knee extension, dorsiflexion, and plantarflexion and 5/5 strength in bilateral lower extremities. No obvious deformities. Minimal swelling and tender to palpation distal to the lateral malleolus near the calcaneofibular and anterior talofibular ligaments. Able to perform lumbar flexion, extension, and rotation as well as foot inversion, eversion with ease. Inversion causes some tingly pain. He is ambulatory.   ED Results / Procedures / Treatments   Labs (all labs ordered are listed, but only abnormal results are displayed) Labs Reviewed  URIC ACID     EKG     RADIOLOGY X Rays of left foot and lumbar spine ordered.  Left Foot IMPRESSION: Negative.  Lumbar spine IMPRESSION: Mild lumbar levoscoliosis with early L4-5 interspace narrowing. No acute findings.  I personally reviewed the x-rays and the radiologist reports.  I agree with the radiologist reports that there are no acute findings.  PROCEDURES:  Critical Care performed: No  Procedures   MEDICATIONS ORDERED IN ED: Medications - No data to display   IMPRESSION / MDM / ASSESSMENT AND PLAN / ED COURSE  I reviewed the triage vital signs and the nursing notes.                              Differential diagnosis includes, but is not limited to, medial malleolus fracture, lateral ankle sprain, gout, compression fracture, L4-5 vertebral narrowing with levoscoliosis  Patient's presentation is most consistent with acute complicated illness / injury requiring diagnostic workup.  Patient is a 28 year old male who presented with left foot pain x 1 day.  X-ray of left foot was ordered in triage, negative for any acute findings.  No fall no injury less concerning for ankle sprain.  Uric  acid ordered to assess for gout, value is 5.0 within normal range.  On physical exam he had decreased sensation on the medial and lateral aspects of his left foot compared to his right, therefore I ordered an x-ray of his lumbar spine.  X-ray of the lumbar spine reveals mild levoscoliosis with L4-L5 interspace narrowing, that may be causing his symptoms of tingliness and decreased sensation in his foot.  He cannot take NSAIDs due to G6PD deficiency with the risk of hemolysis, so I will prescribe him a steroid taper pack.  Patient was given the opportunity to ask questions, all questions were answered.  Emergency department return precautions were discussed with the patient.  Patient is in agreement to the treatment plan.  Patient is stable for discharge.     FINAL CLINICAL IMPRESSION(S) / ED DIAGNOSES   Final diagnoses:  Levoscoliosis of lumbar spine     Rx / DC Orders   ED Discharge Orders          Ordered    predniSONE  (STERAPRED UNI-PAK 21 TAB) 10 MG (21) TBPK tablet        06/30/23 1702             Note:  This document was prepared using Dragon voice recognition software and may include unintentional dictation errors.    Sheron Salm, PA-C 06/30/23 1706    Suzanne Kirsch, MD 06/30/23 1810

## 2023-06-30 NOTE — Discharge Instructions (Addendum)
 You were seen in the emergency department for left foot pain.  Your x-ray showed narrowing of 2 of the vertebra (bones) in the back that may be causing your symptoms .  Please pick up and take the medication as prescribed.  Please return to the emergency department for any new, worsening, or concerning symptoms.

## 2023-06-30 NOTE — ED Triage Notes (Signed)
 Pt c/o left foot pain since yesterday, worse this morning, with no known injury. Swelling noted to ankle area. Pain rated 10/10.
